# Patient Record
Sex: Male | Born: 1980 | Race: Black or African American | Hispanic: No | Marital: Single | Smoking: Current every day smoker
Health system: Southern US, Community
[De-identification: ages and names within clinical notes are randomized; demographics above are authoritative.]

## PROBLEM LIST (undated history)

## (undated) DIAGNOSIS — J302 Other seasonal allergic rhinitis: Secondary | ICD-10-CM

## (undated) HISTORY — PX: OTHER SURGICAL HISTORY: SHX169

---

## 2010-07-08 ENCOUNTER — Emergency Department (HOSPITAL_COMMUNITY)
Admission: EM | Admit: 2010-07-08 | Discharge: 2010-07-08 | Payer: Self-pay | Source: Home / Self Care | Admitting: Emergency Medicine

## 2011-09-29 ENCOUNTER — Encounter (HOSPITAL_COMMUNITY): Payer: Self-pay | Admitting: Emergency Medicine

## 2011-09-29 ENCOUNTER — Emergency Department (HOSPITAL_COMMUNITY)
Admission: EM | Admit: 2011-09-29 | Discharge: 2011-09-29 | Disposition: A | Discharge status: Home / Self Care | Payer: Self-pay | Attending: Emergency Medicine | Admitting: Emergency Medicine

## 2011-09-29 DIAGNOSIS — H60399 Other infective otitis externa, unspecified ear: Secondary | ICD-10-CM

## 2011-09-29 DIAGNOSIS — H61119 Acquired deformity of pinna, unspecified ear: Secondary | ICD-10-CM | POA: Insufficient documentation

## 2011-09-29 DIAGNOSIS — H9209 Otalgia, unspecified ear: Secondary | ICD-10-CM | POA: Insufficient documentation

## 2011-09-29 HISTORY — DX: Other seasonal allergic rhinitis: J30.2

## 2011-09-29 MED ORDER — CLINDAMYCIN HCL 150 MG PO CAPS: 450.0000 mg | ORAL_CAPSULE | Freq: Three times a day (TID) | ORAL | Status: AC

## 2011-09-29 NOTE — ED Notes (Signed)
C/o itching and irritation to R ear lobe from earrings x 2 weeks.  Pt reports drainage.

## 2011-09-29 NOTE — ED Provider Notes (Signed)
Medical screening examination/treatment/procedure(s) were performed by non-physician practitioner and as supervising physician I was immediately available for consultation/collaboration.   Dayton Bailiff, MD 09/29/11 [phone]

## 2011-09-29 NOTE — ED Provider Notes (Signed)
History     CSN: [id number]  Arrival date & time 09/29/11  1616   First MD Initiated Contact with Patient 09/29/11 1656      Chief Complaint  Patient presents with  . Otalgia    pain/infection to ear lobe    The history is provided by the patient.   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 She reports the irritation has been associated with itching and a small amount of clear fluid drainage as well as some surrounding redness. He denies any swelling, pain, fever, neck pain, change in hearing.  Nothing makes the symptoms better or worse. He has tried topical antibiotic ointment without relief.  Past Medical History  Diagnosis Date  . Asthma   . Seasonal allergies     Past Surgical History  Procedure Date  . Earlobe     right earlobe surgery    No family history on file.  History  Substance Use Topics  . Smoking status: Current Everyday Smoker  . Smokeless tobacco: Not on file  . Alcohol Use: Yes      Review of Systems  Constitutional: Negative for fever and chills.  HENT: Negative for hearing loss, ear pain, sore throat and tinnitus.        See HPI  Neurological: Negative for numbness and headaches.    Allergies  Review of patient's allergies indicates no known allergies.  Home Medications  No current outpatient prescriptions on file.  BP 128/75  Pulse 92  Temp(Src) 98.1 F (36.7 C) (Oral)  Resp 20  SpO2 98%  Physical Exam  Nursing note and vitals reviewed. Constitutional: He is oriented to person, place, and time. He appears well-developed and well-nourished. No distress.  HENT:  Head: Normocephalic and atraumatic.       Bilateral earlobes with chronic  deformity secondary to prior surgical procedure. Right earlobe with erythema that does not extend to the tragus or upper ear. There is dry skin over the area of erythema and evidence of dried serous drainage present. There is no pain to palpation of this area and no swelling is seen. There is no induration. Bilateral TMs are normal. Bilateral ear canals are clear.  Eyes: Conjunctivae are normal. Pupils are equal, round, and reactive to light.  Neck: Normal range of motion. Neck supple.  Cardiovascular: Normal rate and regular rhythm.   Pulmonary/Chest: Effort normal. No respiratory distress.  Lymphadenopathy:    He has no cervical adenopathy.  Neurological: He is alert and oriented to person, place, and time. No cranial nerve deficit.  Skin: Skin is warm and dry.       See HENT exam  Psychiatric: He has a normal mood and affect.    ED Course  Procedures (including critical care time)  Labs Reviewed - No data to display No results found.   1. Infected skin of earlobe       MDM  Superficial infection secondary to earring use. No signs of abscess. Based on history of severe infection severe low, will treat with clindamycin. Have advised him to return for reevaluation if no symptom improvement after several days or if any symptom worsening. Patient voices understanding of the plan.        Shaaron Adler, PA-C 09/29/11  1732 

## 2011-09-29 NOTE — Discharge Instructions (Signed)
Skin Infections A skin infection usually develops as a result of disruption of the skin barrier.  CAUSES  A skin infection might occur following:  Trauma or an injury to the skin such as a cut or insect sting.   Inflammation (as in eczema).   Breaks in the skin between the toes (as in athlete's foot).   Swelling (edema).  SYMPTOMS  The legs are the most common site affected. Usually there is:  Redness.   Swelling.   Pain.   There may be red streaks in the area of the infection.  TREATMENT   Minor skin infections may be treated with topical antibiotics, but if the skin infection is severe, hospital care and intravenous (IV) antibiotic treatment may be needed.   Most often skin infections can be treated with oral antibiotic medicine as well as proper rest and elevation of the affected area until the infection improves.   If you are prescribed oral antibiotics, it is important to take them as directed and to take all the pills even if you feel better before you have finished all of the medicine.   You may apply warm compresses to the area for 20-30 minutes 4 times daily.  You might need a tetanus shot now if:  You have no idea when you had the last one.   You have never had a tetanus shot before.   Your wound had dirt in it.  If you need a tetanus shot and you decide not to get one, there is a rare chance of getting tetanus. Sickness from tetanus can be serious. If you get a tetanus shot, your arm may swell and become red and warm at the shot site. This is common and not a problem. SEEK MEDICAL CARE IF:  The pain and swelling from your infection do not improve within 2 days.  SEEK IMMEDIATE MEDICAL CARE IF:  You develop a fever, chills, or other serious problems.  Document Released: 08/29/2004 Document Revised: 04/03/2011 Document Reviewed: 07/11/2008 [facility] Patient Information 2012 [place], [place].         Clindamycin capsules What is this medicine? CLINDAMYCIN  (KLIN da MYE sin) is a lincosamide antibiotic. It is used to treat certain kinds of bacterial infections. It will not work for colds, flu, or other viral infections. This medicine may be used for other purposes; ask your health care provider or pharmacist if you have questions. What should I tell my health care provider before I take this medicine? They need to know if you have any of these conditions: -kidney disease -liver disease -stomach problems like colitis -an unusual or allergic reaction to clindamycin, lincomycin, or other medicines, foods, dyes like tartrazine or preservatives -pregnant or trying to get pregnant -breast-feeding How should I use this medicine? Take this medicine by mouth with a full glass of water. Follow the directions on the prescription label. You can take this medicine with food or on an empty stomach. If the medicine upsets your stomach, take it with food. Take your medicine at regular intervals. Do not take your medicine more often than directed. Take all of your medicine as directed even if you think your are better. Do not skip doses or stop your medicine early. Talk to your pediatrician regarding the use of this medicine in children. Special care may be needed. Overdosage: If you think you have taken too much of this medicine contact a poison control center or emergency room at once. NOTE: This medicine is only for you. Do  not share this medicine with others. What if I miss a dose? If you miss a dose, take it as soon as you can. If it is almost time for your next dose, take only that dose. Do not take double or extra doses. What may interact with this medicine? -chloramphenicol -erythromycin -kaolin products This list may not describe all possible interactions. Give your health care provider a list of all the medicines, herbs, non-prescription drugs, or dietary supplements you use. Also tell them if you smoke, drink alcohol, or use illegal drugs. Some items may  interact with your medicine. What should I watch for while using this medicine? Tell your doctor or healthcare professional if your symptoms do not start to get better or if they get worse. Do not treat diarrhea with over the counter products. Contact your doctor if you have diarrhea that lasts more than 2 days or if it is severe and watery. What side effects may I notice from receiving this medicine? Side effects that you should report to your doctor or health care professional as soon as possible: -allergic reactions like skin rash, itching or hives, swelling of the face, lips, or tongue -dark urine -pain on swallowing -redness, blistering, peeling or loosening of the skin, including inside the mouth -unusual bleeding or bruising -unusually weak or tired -yellowing of eyes or skin Side effects that usually do not require medical attention (report to your doctor or health care professional if they continue or are bothersome): -diarrhea -itching in the rectal or genital area -joint pain -nausea, vomiting -stomach pain This list may not describe all possible side effects. Call your doctor for medical advice about side effects. You may report side effects to FDA at 1-800-FDA-1088. Where should I keep my medicine? Keep out of the reach of children. Store at room temperature between 20 and 25 degrees C (68 and 77 degrees F). Throw away any unused medicine after the expiration date. NOTE: This sheet is a summary. It may not cover all possible information. If you have questions about this medicine, talk to your doctor, pharmacist, or health care provider.  2012, [employer]vier/[employer] Standard. (12/13/2009 10:12:31 AM)

## 2017-09-12 ENCOUNTER — Encounter (HOSPITAL_COMMUNITY): Payer: Self-pay

## 2017-09-12 ENCOUNTER — Emergency Department (HOSPITAL_COMMUNITY): Payer: Self-pay

## 2017-09-12 ENCOUNTER — Emergency Department (HOSPITAL_COMMUNITY)
Admission: EM | Admit: 2017-09-12 | Discharge: 2017-09-12 | Disposition: A | Discharge status: Home / Self Care | Payer: Self-pay | Attending: Emergency Medicine | Admitting: Emergency Medicine

## 2017-09-12 DIAGNOSIS — W540XXA Bitten by dog, initial encounter: Secondary | ICD-10-CM | POA: Insufficient documentation

## 2017-09-12 DIAGNOSIS — Y939 Activity, unspecified: Secondary | ICD-10-CM | POA: Insufficient documentation

## 2017-09-12 DIAGNOSIS — Y998 Other external cause status: Secondary | ICD-10-CM | POA: Insufficient documentation

## 2017-09-12 DIAGNOSIS — Z2914 Encounter for prophylactic rabies immune globin: Secondary | ICD-10-CM | POA: Insufficient documentation

## 2017-09-12 DIAGNOSIS — J45909 Unspecified asthma, uncomplicated: Secondary | ICD-10-CM | POA: Insufficient documentation

## 2017-09-12 DIAGNOSIS — F17210 Nicotine dependence, cigarettes, uncomplicated: Secondary | ICD-10-CM | POA: Insufficient documentation

## 2017-09-12 DIAGNOSIS — S71151A Open bite, right thigh, initial encounter: Secondary | ICD-10-CM | POA: Insufficient documentation

## 2017-09-12 DIAGNOSIS — Z23 Encounter for immunization: Secondary | ICD-10-CM | POA: Insufficient documentation

## 2017-09-12 DIAGNOSIS — Y92009 Unspecified place in unspecified non-institutional (private) residence as the place of occurrence of the external cause: Secondary | ICD-10-CM | POA: Insufficient documentation

## 2017-09-12 MED ORDER — RABIES VACCINE, PCEC IM SUSR
1.0000 mL | Freq: Once | INTRAMUSCULAR | Status: AC
  Administered 2017-09-12: 1 mL via INTRAMUSCULAR
  Filled 2017-09-12 (×3): qty 1

## 2017-09-12 MED ORDER — AMOXICILLIN-POT CLAVULANATE 875-125 MG PO TABS: 1.0000 | ORAL_TABLET | Freq: Two times a day (BID) | ORAL | 0 refills | Status: AC

## 2017-09-12 MED ORDER — AMOXICILLIN-POT CLAVULANATE 875-125 MG PO TABS
1.0000 | ORAL_TABLET | Freq: Once | ORAL | Status: AC
  Administered 2017-09-12: 1 via ORAL
  Filled 2017-09-12: qty 1

## 2017-09-12 MED ORDER — RABIES IMMUNE GLOBULIN 150 UNIT/ML IM INJ
1500.0000 [IU] | INJECTION | Freq: Once | INTRAMUSCULAR | Status: AC
  Administered 2017-09-12: 1500 [IU] via INTRAMUSCULAR
  Filled 2017-09-12: qty 10

## 2017-09-12 MED ORDER — TETANUS-DIPHTH-ACELL PERTUSSIS 5-2.5-18.5 LF-MCG/0.5 IM SUSP
0.5000 mL | Freq: Once | INTRAMUSCULAR | Status: AC
  Administered 2017-09-12: 0.5 mL via INTRAMUSCULAR
  Filled 2017-09-12: qty 0.5

## 2017-09-12 NOTE — ED Notes (Signed)
Returned from xray

## 2017-09-12 NOTE — ED Triage Notes (Signed)
Per Pt, Pt is coming from home with complaints of dog bite to the right thigh that took place on Saturday evening. Pt did not get it checked out over the weekend, but would like it to be seen. Unknown who the owner is. Bite marks are scabbed over and noted to have some erythema, but no discharge or swelling.

## 2017-09-12 NOTE — ED Provider Notes (Signed)
[facility] EMERGENCY DEPARTMENT Provider Note   CSN: [id number] Arrival date & time: 09/12/17  1324     History   Chief Complaint Chief Complaint  Patient presents with  . Animal Bite    HPI KOICHI PLATTE is a 37 y.o. male.  HPI  Patient is a 37 year old male with a history of asthma and allergic rhinitis presenting for an animal bite to the right inner thigh that occurred approximately 6 days ago.  Patient is noted he has an increased sensation of tightness around this bite as well as some erythema surrounding it.  Patient reports that when this originally happened, he was bitten, unprovoked by a [place] shepherd at a house party.  Patient reports he was significantly intoxicated at the time and did not no the owner of the house, the owner of the dog, and has no way to tract on this information.  Patient denies any fevers, chills, or purulent drainage from the wound.  Patient does not know his tetanus shot is up-to-date.  Past Medical History:  Diagnosis Date  . Asthma   . Seasonal allergies     There are no active problems to display for this patient.   Past Surgical History:  Procedure Laterality Date  . earlobe     right earlobe surgery       Home Medications    Prior to Admission medications   Not on File    Family History No family history on file.  Social History Social History   Tobacco Use  . Smoking status: Current Every Day Smoker    Packs/day: 0.20    Types: Cigarettes  . Smokeless tobacco: Never Used  Substance Use Topics  . Alcohol use: Yes  . Drug use: No     Allergies   Patient has no known allergies.   Review of Systems Review of Systems  Constitutional: Negative for chills and fever.  Musculoskeletal: Negative for arthralgias.  Skin: Positive for color change and wound.  Neurological: Negative for weakness and numbness.     Physical Exam Updated Vital Signs BP 122/87 (BP Location: Right Arm)    Pulse 77   Temp 98.7 F (37.1 C) (Oral)   Resp 16   Ht 6' (1.829 m)   Wt 79.4 kg (175 lb)   SpO2 100%   BMI 23.73 kg/m   Physical Exam  Constitutional: He appears well-developed and well-nourished. No distress.  Sitting comfortably in bed.  HENT:  Head: Normocephalic and atraumatic.  Eyes: Conjunctivae are normal. Right eye exhibits no discharge. Left eye exhibits no discharge.  EOMs normal to gross examination.  Neck: Normal range of motion.  Cardiovascular: Normal rate and regular rhythm.  Intact, 2+ radial pulse.  Pulmonary/Chest:  Normal respiratory effort. Patient converses comfortably. No audible wheeze or stridor.  Abdominal: He exhibits no distension.  Musculoskeletal: Normal range of motion.  Neurological: He is alert.  Cranial nerves intact to gross observation. Patient moves extremities without difficulty. Sensation intact in the right lower extremity distal to the wound.  Skin: Skin is warm and dry. He is not diaphoretic.  There are 3 punctate lesions that are well healing and the skin on the right inner thigh.  There is no erythema surrounding the punctate wounds, however just lateral to these there is a streak of erythema.  Psychiatric: He has a normal mood and affect. His behavior is normal. Judgment and thought content normal.  Nursing note and vitals reviewed.    ED Treatments /  Results  Labs (all labs ordered are listed, but only abnormal results are displayed) Labs Reviewed - No data to display  EKG  EKG Interpretation None       Radiology Dg Femur Min 2 Views Right  Result Date: 09/12/2017 CLINICAL DATA:  Pt was bit by dog on inner thigh, near knee. Pt states he is having some irritation and pain where the bit marks are. Pt denies and swelling or fever, he does state that there is some redness in the area around the marks. EXAM: RIGHT FEMUR 2 VIEWS COMPARISON:  None. FINDINGS: There is no evidence of fracture or other focal bone lesions. Soft  tissues are unremarkable. No foreign body appreciated within the soft tissues. IMPRESSION: Negative. Electronically Signed   By: Bary RichardStan  Maynard M.D.   On: 09/12/2017 10:14    Procedures Procedures (including critical care time)  Medications Ordered in ED Medications  rabies vaccine (RABAVERT) injection 1 mL (not administered)  rabies immune globulin (HYPERAB/KEDRAB) injection 1,500 Units (1,500 Units Intramuscular Given 09/12/17 1030)  Tdap (BOOSTRIX) injection 0.5 mL (0.5 mLs Intramuscular Given 09/12/17 1027)     Initial Impression / Assessment and Plan / ED Course  I have reviewed the triage vital signs and the nursing notes.  Pertinent labs & imaging results that were available during my care of the patient were reviewed by me and considered in my medical decision making (see chart for details).     Patient is nontoxic-appearing, afebrile, and in no acute distress.  Patient exhibits a unprovoked animal bite from a dog.  Patient does not know the vaccination status of this animal, and has no way of obtaining this information.  Will proceed with tetanus immunization as well as rabies vaccination.  Additionally, patient prescribed Augmentin.  Patient given return precautions for any increasing erythema, fever, chills, or purulent drainage.  Patient also given instructions on how [facility].  Patient is in understanding and agrees with the plan of care.  Final Clinical Impressions(s) / ED Diagnoses   Final diagnoses:  Dog bite, initial encounter    ED Discharge Orders    None       Delia ChimesMurray, Alyssa B, PA-C 09/12/17 1142    Rolan BuccoBelfi, Melanie, MD 09/12/17 1336

## 2017-09-12 NOTE — Discharge Instructions (Addendum)
Please see the information and instructions below regarding your visit.  Your diagnoses today include:  1. Dog bite, initial encounter    We will treat you for an infection in your wound.  This is a small infection at this time, however we want to treat it to make sure it does not get worse, as dog bites have a high risk of infection.  Tests performed today include: See side panel of your discharge paperwork for testing performed today. Vital signs are listed at the bottom of these instructions.   X-rays show evident no evidence of retained teeth.  Medications prescribed:    Take any prescribed medications only as prescribed, and any over the counter medications only as directed on the packaging.  Please take all of your antibiotics until finished.   You may develop abdominal discomfort or nausea from the antibiotic. If this occurs, you may take it with food. Some patients also get diarrhea with antibiotics. You may help offset this with probiotics which you can buy or get in yogurt. Do not eat or take the probiotics until 2 hours after your antibiotic. Some women develop vaginal yeast infections after antibiotics. If you develop unusual vaginal discharge after being on this medication, please see your primary care provider.   Some people develop allergies to antibiotics. Symptoms of antibiotic allergy can be mild and include a flat rash and itching. They can also be more serious and include:  ?Hives - Hives are raised, red patches of skin that are usually very itchy.  ?Lip or tongue swelling  ?Trouble swallowing or breathing  ?Blistering of the skin or mouth.  If you have any of these serious symptoms, please seek emergency medical care immediately.   Home care instructions:  Please follow any educational materials contained in this packet.   Please wash with warm soap and water.  Follow-up instructions:  Please follow-up with urgent care listed in this paperwork.  Return  instructions:  Please return to the Emergency Department if you experience worsening symptoms.  Please return the emergency department for any fevers, chills, increasing redness around the wound, or drainage is green or yellow. Please return if you have any other emergent concerns.  Additional Information:   Your vital signs today were: BP 122/87 (BP Location: Right Arm)    Pulse 77    Temp 98.7 F (37.1 C) (Oral)    Resp 16    Ht 6' (1.829 m)    Wt 79.4 kg (175 lb)    SpO2 100%    BMI 23.73 kg/m  If your blood pressure (BP) was elevated on multiple readings during this visit above 130 for the top number or above 80 for the bottom number, please have this repeated by your primary care provider within one month. --------------  Thank you for allowing us to participate in your care today.

## 2017-09-15 ENCOUNTER — Ambulatory Visit (HOSPITAL_COMMUNITY)
Admission: EM | Admit: 2017-09-15 | Discharge: 2017-09-15 | Disposition: A | Discharge status: Home / Self Care | Payer: Self-pay | Attending: Internal Medicine | Admitting: Internal Medicine

## 2017-09-15 DIAGNOSIS — Z23 Encounter for immunization: Secondary | ICD-10-CM

## 2017-09-15 DIAGNOSIS — Z203 Contact with and (suspected) exposure to rabies: Secondary | ICD-10-CM

## 2017-09-15 MED ORDER — RABIES VACCINE, PCEC IM SUSR
1.0000 mL | Freq: Once | INTRAMUSCULAR | Status: AC
  Administered 2017-09-15: 1 mL via INTRAMUSCULAR

## 2017-09-15 MED ORDER — RABIES VACCINE, PCEC IM SUSR
INTRAMUSCULAR | Status: AC
  Filled 2017-09-15: qty 1

## 2017-09-15 NOTE — ED Notes (Signed)
Pt here for day 3 rabies shot. States the wound is healing up fine, no questions asked.

## 2017-09-19 ENCOUNTER — Ambulatory Visit (HOSPITAL_COMMUNITY)
Admission: EM | Admit: 2017-09-19 | Discharge: 2017-09-19 | Disposition: A | Discharge status: Home / Self Care | Payer: Self-pay | Attending: Internal Medicine | Admitting: Internal Medicine

## 2017-09-19 ENCOUNTER — Encounter (HOSPITAL_COMMUNITY): Payer: Self-pay | Admitting: Emergency Medicine

## 2017-09-19 DIAGNOSIS — Z23 Encounter for immunization: Secondary | ICD-10-CM

## 2017-09-19 DIAGNOSIS — Z203 Contact with and (suspected) exposure to rabies: Secondary | ICD-10-CM

## 2017-09-19 MED ORDER — RABIES VACCINE, PCEC IM SUSR
1.0000 mL | Freq: Once | INTRAMUSCULAR | Status: AC
  Administered 2017-09-19: 1 mL via INTRAMUSCULAR

## 2017-09-19 MED ORDER — RABIES VACCINE, PCEC IM SUSR
INTRAMUSCULAR | Status: AC
  Filled 2017-09-19: qty 1

## 2017-09-19 NOTE — Discharge Instructions (Signed)
Return from 09/26/17 for day 7 rabies vaccination

## 2017-09-19 NOTE — ED Triage Notes (Signed)
PT C/O: here for rabies vaccination day 7 (#3)... Voices no new concerns  A&O x4... NAD... Ambulatory

## 2017-09-26 ENCOUNTER — Encounter (HOSPITAL_COMMUNITY): Payer: Self-pay | Admitting: Family Medicine

## 2017-09-26 ENCOUNTER — Ambulatory Visit (HOSPITAL_COMMUNITY)
Admission: EM | Admit: 2017-09-26 | Discharge: 2017-09-26 | Disposition: A | Discharge status: Home / Self Care | Payer: Self-pay | Attending: Family Medicine | Admitting: Family Medicine

## 2017-09-26 DIAGNOSIS — F17210 Nicotine dependence, cigarettes, uncomplicated: Secondary | ICD-10-CM | POA: Insufficient documentation

## 2017-09-26 DIAGNOSIS — Z203 Contact with and (suspected) exposure to rabies: Secondary | ICD-10-CM

## 2017-09-26 DIAGNOSIS — Z23 Encounter for immunization: Secondary | ICD-10-CM

## 2017-09-26 MED ORDER — RABIES VACCINE, PCEC IM SUSR
INTRAMUSCULAR | Status: AC
  Filled 2017-09-26: qty 1

## 2017-09-26 MED ORDER — RABIES VACCINE, PCEC IM SUSR
1.0000 mL | Freq: Once | INTRAMUSCULAR | Status: AC
  Administered 2017-09-26: 1 mL via INTRAMUSCULAR

## 2017-09-26 NOTE — ED Notes (Addendum)
Pt admits to drinking today and has a water bottle filled with an orange substance.  Pt states the girl he had sexual intercourse with gave him four pills to take, but he states he did not take them.  He states he took her to her PCP today and she was diagnosed with Trichomonas

## 2017-09-26 NOTE — ED Triage Notes (Signed)
Pt here for rabies shot and sts that he has been exposed to [place]

## 2017-09-29 LAB — URINE CYTOLOGY ANCILLARY ONLY
Chlamydia: NEGATIVE
Neisseria Gonorrhea: NEGATIVE
Trichomonas: NEGATIVE

## 2017-10-07 NOTE — ED Provider Notes (Signed)
  [facility]   [id number] 09/26/17 Arrival Time: 1722   SUBJECTIVE:  Donald Mccann is a 37 y.o. male who presents to the urgent care with complaint of need for rabies vaccine.    Past Medical History:  Diagnosis Date  . Asthma   . Seasonal allergies    History reviewed. No pertinent family history. Social History   Socioeconomic History  . Marital status: Single    Spouse name: Not on file  . Number of children: Not on file  . Years of education: Not on file  . Highest education level: Not on file  Social Needs  . Financial resource strain: Not on file  . Food insecurity - worry: Not on file  . Food insecurity - inability: Not on file  . Transportation needs - medical: Not on file  . Transportation needs - non-medical: Not on file  Occupational History  . Not on file  Tobacco Use  . Smoking status: Current Every Day Smoker    Packs/day: 0.20    Types: Cigarettes  . Smokeless tobacco: Never Used  Substance and Sexual Activity  . Alcohol use: Yes  . Drug use: No  . Sexual activity: Not on file  Other Topics Concern  . Not on file  Social History Narrative  . Not on file   No outpatient medications have been marked as taking for the 09/26/17 encounter [facility](Hospital Encounter).   No Known Allergies    ROS: As per HPI, remainder of ROS negative.   OBJECTIVE:   Vitals:   09/26/17 1745  BP: 139/84  Pulse: (!) 110  Resp: 18  Temp: 98.2 F (36.8 C)  SpO2: 100%     General appearance: alert; no distress Eyes: PERRL; EOMI; conjunctiva normal HENT: normocephalic; atraumatic; TMs normal, canal normal, external ears normal without trauma; nasal mucosa normal; oral mucosa normal Neck: supple Lungs: clear to auscultation bilaterally Heart: regular rate and rhythm Abdomen: soft, non-tender; bowel sounds normal; no masses or organomegaly; no guarding or rebound tenderness Back: no CVA tenderness Extremities: no cyanosis or edema; symmetrical with  no gross deformities Skin: warm and dry Neurologic: normal gait; grossly normal Psychological: alert and cooperative; normal mood and affect      Labs:  Results for orders placed or performed during the hospital encounter of 09/26/17  Urine cytology ancillary only  Result Value Ref Range   Chlamydia Negative    Neisseria gonorrhea Negative    Trichomonas Negative     Labs Reviewed  URINE CYTOLOGY ANCILLARY ONLY    No results found.     ASSESSMENT & PLAN:  1. Need for post exposure prophylaxis for rabies   2. Rabies exposure     Meds ordered this encounter  Medications  . rabies vaccine (RABAVERT) injection 1 mL    Reviewed expectations re: course of current medical issues. Questions answered. Outlined signs and symptoms indicating need for more acute intervention. Patient verbalized understanding. After Visit Summary given.    Procedures:      Elvina SidleLauenstein, Sherylann Vangorden, MD 10/07/17 [phone]

## 2018-08-04 ENCOUNTER — Encounter (HOSPITAL_COMMUNITY): Payer: Self-pay

## 2018-08-04 ENCOUNTER — Emergency Department (HOSPITAL_COMMUNITY)
Admission: EM | Admit: 2018-08-04 | Discharge: 2018-08-04 | Disposition: A | Discharge status: Home / Self Care | Payer: Self-pay | Attending: Emergency Medicine | Admitting: Emergency Medicine

## 2018-08-04 DIAGNOSIS — Z0000 Encounter for general adult medical examination without abnormal findings: Secondary | ICD-10-CM | POA: Insufficient documentation

## 2018-08-04 DIAGNOSIS — F17210 Nicotine dependence, cigarettes, uncomplicated: Secondary | ICD-10-CM | POA: Insufficient documentation

## 2018-08-04 DIAGNOSIS — J45909 Unspecified asthma, uncomplicated: Secondary | ICD-10-CM | POA: Insufficient documentation

## 2018-08-04 NOTE — ED Provider Notes (Signed)
Emergency Department Provider Note   I have reviewed the triage vital signs and the nursing notes.   HISTORY  Chief Complaint Check-up   HPI Donald Mccann is a 37 y.o. male without significant past medical problems presents the emergency department today for checkup.  Patient states that his sexual partner was diagnosed with an E. coli urinary tract infection and this worried him she wanted to come get checked out.  Patient has no dysuria, polyuria, change in color or smell of urine.  Patient has no other complaints this wants to be checked to make sure he did not get E. coli. No other associated or modifying symptoms.    Past Medical History:  Diagnosis Date  . Asthma   . Seasonal allergies     There are no active problems to display for this patient.   Past Surgical History:  Procedure Laterality Date  . earlobe     right earlobe surgery      Allergies Patient has no known allergies.  History reviewed. No pertinent family history.  Social History Social History   Tobacco Use  . Smoking status: Current Every Day Smoker    Packs/day: 0.20    Types: Cigarettes  . Smokeless tobacco: Never Used  Substance Use Topics  . Alcohol use: Yes  . Drug use: No    Review of Systems  All other systems negative except as documented in the HPI. All pertinent positives and negatives as reviewed in the HPI. ____________________________________________   PHYSICAL EXAM:  VITAL SIGNS: ED Triage Vitals  Enc Vitals Group     BP 08/04/18 0818 132/82     Pulse Rate 08/04/18 0818 85     Resp 08/04/18 0818 16     Temp 08/04/18 0818 97.9 F (36.6 C)     Temp Source 08/04/18 0818 Oral     SpO2 08/04/18 0818 100 %     Weight 08/04/18 0818 180 lb (81.6 kg)     Height 08/04/18 0818 6' (1.829 m)     Head Circumference --      Peak Flow --      Pain Score 08/04/18 0821 0     Pain Loc --      Pain Edu? --      Excl. in GC? --     Constitutional: Alert and  oriented. Well appearing and in no acute distress. Eyes: Conjunctivae are normal. PERRL. EOMI. Head: Atraumatic. Nose: No congestion/rhinnorhea. Mouth/Throat: Mucous membranes are moist.  Oropharynx non-erythematous. Neck: No stridor.  No meningeal signs.   Cardiovascular: Normal rate, regular rhythm. Good peripheral circulation. Grossly normal heart sounds.   Respiratory: Normal respiratory effort.  No retractions. Lungs CTAB. Gastrointestinal: Soft and nontender. No distention.  Musculoskeletal: No lower extremity tenderness nor edema. No gross deformities of extremities. Neurologic:  Normal speech and language. No gross focal neurologic deficits are appreciated.  Skin:  Skin is warm, dry and intact. No rash noted.  ____________________________________________     INITIAL IMPRESSION / ASSESSMENT AND PLAN / ED COURSE  Had a long discussion with patient and his partner the E. coli was not contagious by sexual intercourse.  If is asymptomatic, checking a urinalysis seemed to be futile.  Was okay with his explanation and was discharged stable condition.     Pertinent labs & imaging results that were available during my care of the patient were reviewed by me and considered in my medical decision making (see chart for details).  ____________________________________________  FINAL CLINICAL  IMPRESSION(S) / ED DIAGNOSES  Final diagnoses:  Routine check-up     MEDICATIONS GIVEN DURING THIS VISIT:  Medications - No data to display   NEW OUTPATIENT MEDICATIONS STARTED DURING THIS VISIT:  New Prescriptions   No medications on file    Note:  This note was prepared with assistance of Dragon voice recognition software. Occasional wrong-word or sound-a-like substitutions may have occurred due to the inherent limitations of voice recognition software.   Marily MemosMesner, Analya Louissaint, MD 08/04/18 [phone]

## 2018-08-04 NOTE — ED Triage Notes (Signed)
Pt presents with c/o request to have his urine tested for e-coli. Pt reports his partner recently tested positive for e-coli in her urine and he would like to be tested as well.

## 2018-08-04 NOTE — Discharge Instructions (Signed)
You don't have an e coli infection. I have given you this hand out so that you have a reference for worrisome symptoms.

## 2019-08-31 IMAGING — DX DG FEMUR 2+V*R*
4 series · 4 of 4 positions shown · non-contrast
Comparison: None.

CLINICAL DATA: Pt was bit by dog on inner thigh, near knee. Pt
states he is having some irritation and pain where the bit marks
are. Pt denies and swelling or fever, he does state that there is
some redness in the area around the marks.

EXAM:
RIGHT FEMUR 2 VIEWS

[femur ap (1 of 2)]
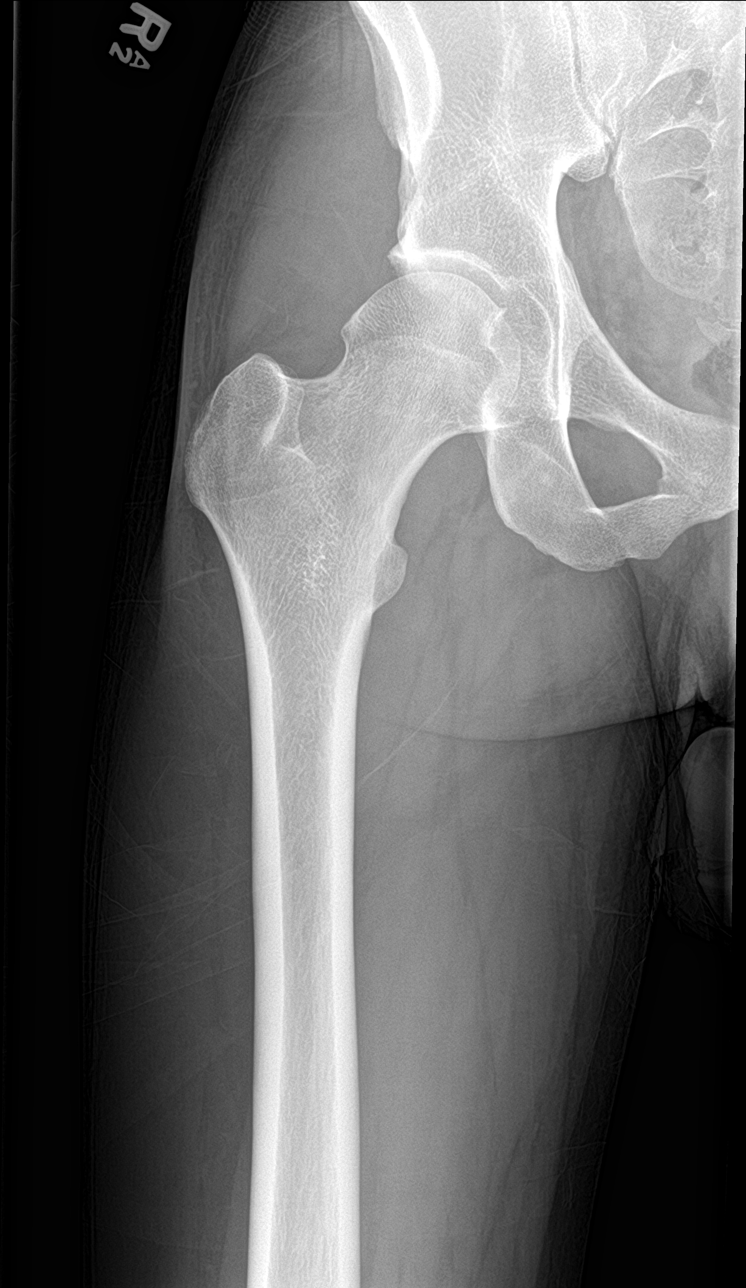

[femur ap (2 of 2)]
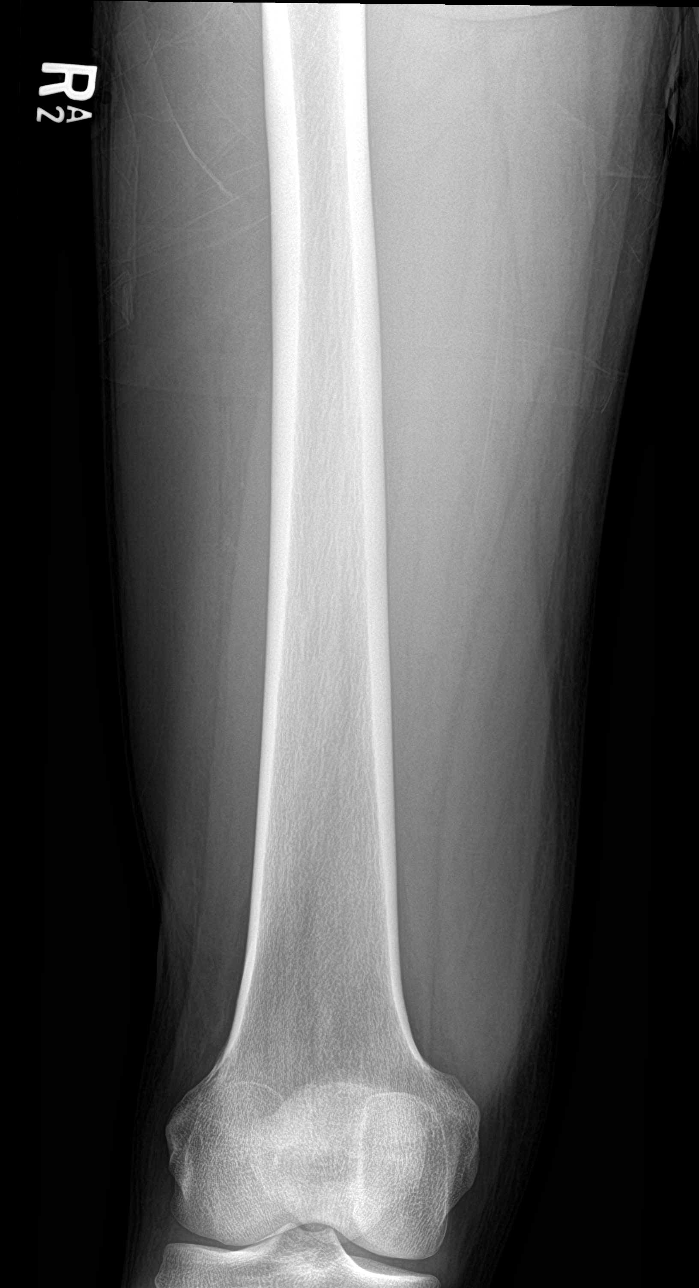

[femur lat (1 of 2)]
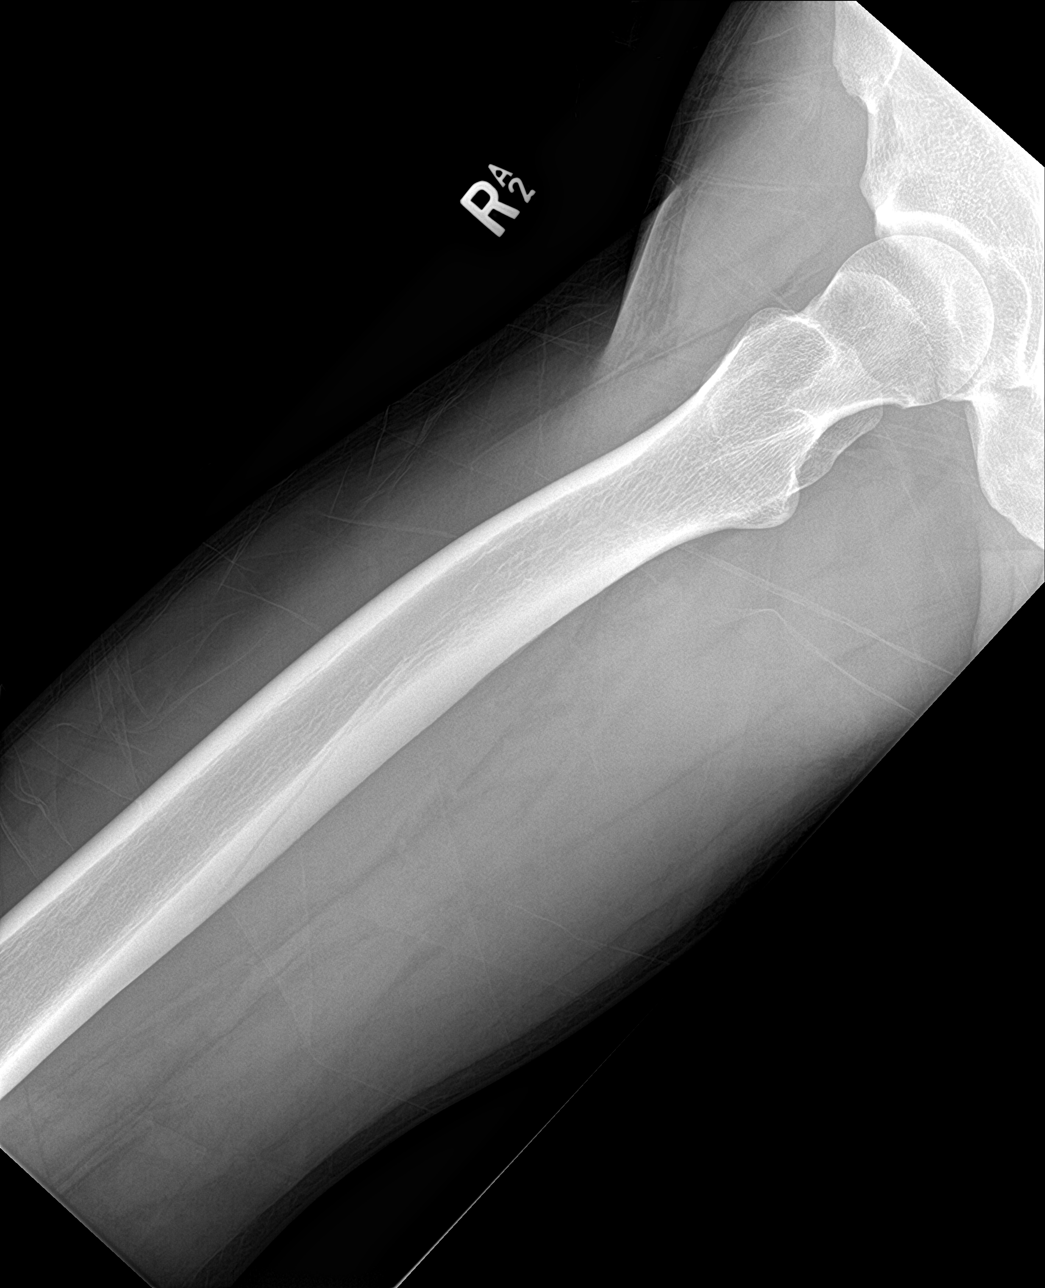

[femur lat (2 of 2)]
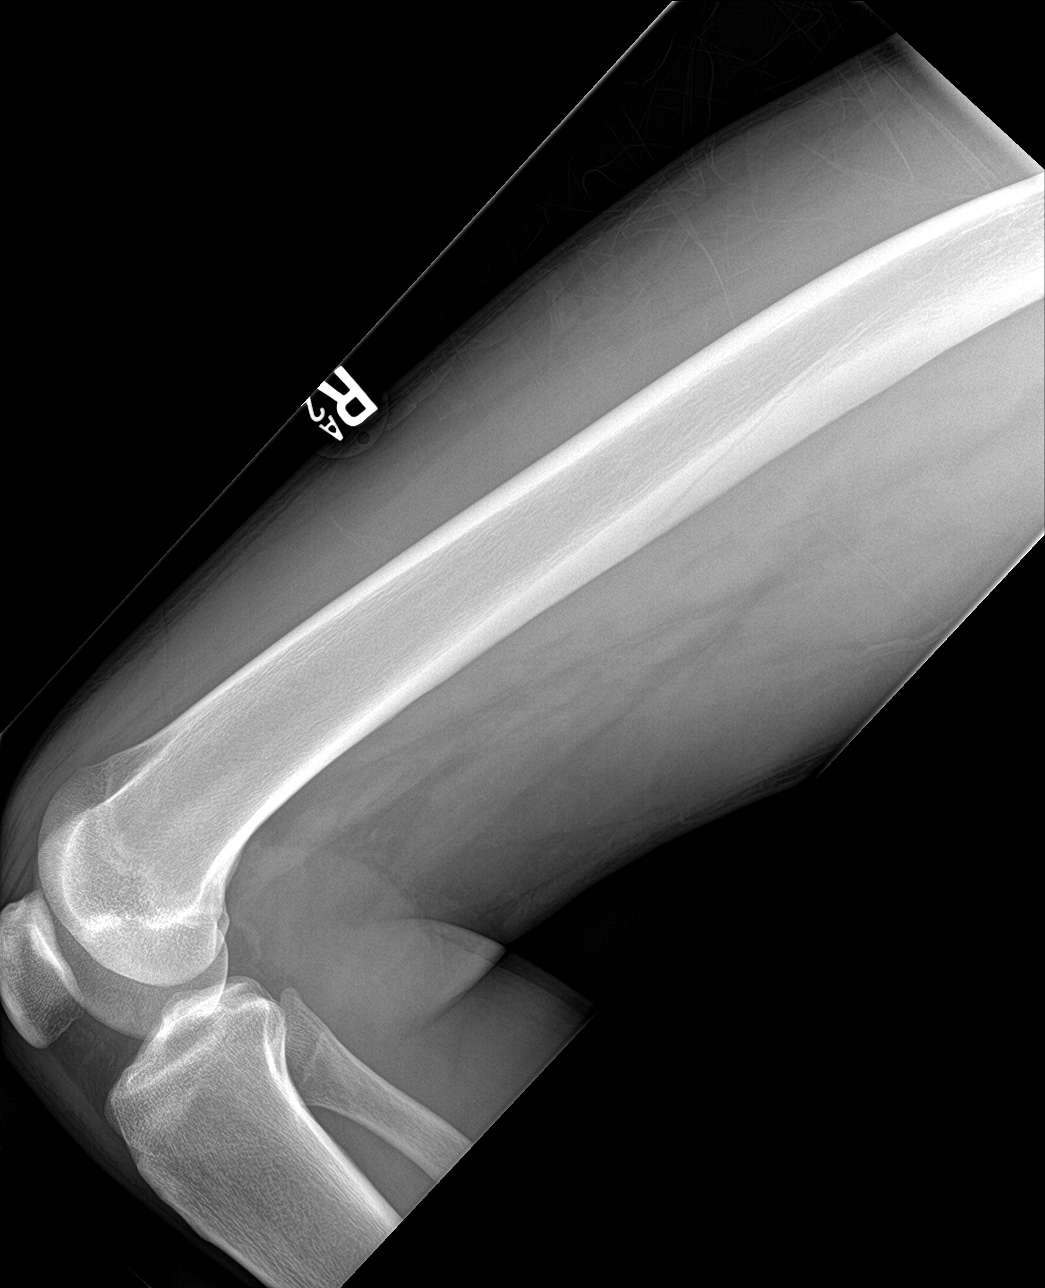

[4 of 4 positions shown; findings below may reference images not displayed]

FINDINGS: There is no evidence of fracture or other focal bone lesions. Soft
tissues are unremarkable. No foreign body appreciated within the
soft tissues.
IMPRESSION: Negative.
